# Patient Record
Sex: Male | Born: 1955 | Race: White | Hispanic: No | Marital: Married | Smoking: Never smoker
Health system: Southern US, Community
[De-identification: ages and names within clinical notes are randomized; demographics above are authoritative.]

## PROBLEM LIST (undated history)

## (undated) DIAGNOSIS — G4730 Sleep apnea, unspecified: Secondary | ICD-10-CM

## (undated) HISTORY — PX: INGUINAL HERNIA REPAIR: SUR1180

## (undated) HISTORY — PX: KNEE ARTHROSCOPY: SUR90

## (undated) HISTORY — PX: CORONARY ANGIOPLASTY: SHX604

---
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
## 1983-05-08 DIAGNOSIS — F32A Depression, unspecified: Secondary | ICD-10-CM

## 1983-05-08 HISTORY — DX: Depression, unspecified: F32.A

## 1992-05-07 DIAGNOSIS — M1990 Unspecified osteoarthritis, unspecified site: Secondary | ICD-10-CM

## 1992-05-07 DIAGNOSIS — E785 Hyperlipidemia, unspecified: Secondary | ICD-10-CM

## 1992-05-07 HISTORY — DX: Unspecified osteoarthritis, unspecified site: M19.90

## 1992-05-07 HISTORY — DX: Hyperlipidemia, unspecified: E78.5

## 1999-05-08 DIAGNOSIS — I10 Essential (primary) hypertension: Secondary | ICD-10-CM

## 1999-05-08 HISTORY — DX: Essential (primary) hypertension: I10

## 2015-09-05 DIAGNOSIS — I219 Acute myocardial infarction, unspecified: Secondary | ICD-10-CM

## 2015-09-05 HISTORY — DX: Acute myocardial infarction, unspecified: I21.9

## 2016-05-07 DIAGNOSIS — I2510 Atherosclerotic heart disease of native coronary artery without angina pectoris: Secondary | ICD-10-CM

## 2016-05-07 DIAGNOSIS — C801 Malignant (primary) neoplasm, unspecified: Secondary | ICD-10-CM

## 2016-05-07 DIAGNOSIS — C140 Malignant neoplasm of pharynx, unspecified: Secondary | ICD-10-CM

## 2016-05-07 HISTORY — DX: Atherosclerotic heart disease of native coronary artery without angina pectoris: I25.10

## 2016-05-07 HISTORY — DX: Malignant neoplasm of pharynx, unspecified: C14.0

## 2016-05-07 HISTORY — DX: Malignant (primary) neoplasm, unspecified: C80.1

## 2016-12-05 HISTORY — PX: PEG PLACEMENT: SHX5437

## 2018-05-07 HISTORY — PX: ORIF ANKLE FRACTURE: SUR919

## 2018-05-07 HISTORY — PX: FRACTURE SURGERY: SHX138

## 2018-06-19 HISTORY — PX: ORIF ANKLE FRACTURE: SUR919

## 2018-11-05 DIAGNOSIS — C3490 Malignant neoplasm of unspecified part of unspecified bronchus or lung: Secondary | ICD-10-CM

## 2018-11-05 HISTORY — DX: Malignant neoplasm of unspecified part of unspecified bronchus or lung: C34.90

## 2019-05-08 HISTORY — PX: REPLACEMENT TOTAL KNEE: SUR1224

## 2019-05-28 HISTORY — PX: REPLACEMENT TOTAL KNEE: SUR1224

## 2019-06-08 HISTORY — PX: INGUINAL HERNIA REPAIR: SUR1180

## 2019-07-13 HISTORY — PX: INGUINAL HERNIA REPAIR: SUR1180

## 2019-10-16 ENCOUNTER — Other Ambulatory Visit: Payer: Self-pay | Admitting: Orthopaedic Surgery

## 2020-02-19 ENCOUNTER — Encounter (HOSPITAL_COMMUNITY): Payer: Self-pay

## 2020-02-19 ENCOUNTER — Encounter (HOSPITAL_COMMUNITY)
Admission: RE | Admit: 2020-02-19 | Discharge: 2020-02-19 | Disposition: A | Discharge status: Home / Self Care | Payer: Worker's Compensation | Source: Ambulatory Visit | Attending: Orthopaedic Surgery | Admitting: Orthopaedic Surgery

## 2020-02-19 ENCOUNTER — Other Ambulatory Visit (HOSPITAL_COMMUNITY)
Admission: RE | Admit: 2020-02-19 | Discharge: 2020-02-19 | Disposition: A | Discharge status: Home / Self Care | Payer: HRSA Program | Source: Ambulatory Visit | Attending: Orthopaedic Surgery | Admitting: Orthopaedic Surgery

## 2020-02-19 ENCOUNTER — Other Ambulatory Visit: Payer: Self-pay

## 2020-02-19 DIAGNOSIS — Z20822 Contact with and (suspected) exposure to covid-19: Secondary | ICD-10-CM | POA: Insufficient documentation

## 2020-02-19 DIAGNOSIS — Z01810 Encounter for preprocedural cardiovascular examination: Secondary | ICD-10-CM | POA: Diagnosis present

## 2020-02-19 DIAGNOSIS — Z01812 Encounter for preprocedural laboratory examination: Secondary | ICD-10-CM | POA: Insufficient documentation

## 2020-02-19 HISTORY — DX: Sleep apnea, unspecified: G47.30

## 2020-02-19 LAB — CBC
HCT: 46.5 % (ref 39.0–52.0)
Hemoglobin: 15.5 g/dL (ref 13.0–17.0)
MCH: 31.2 pg (ref 26.0–34.0)
MCHC: 33.3 g/dL (ref 30.0–36.0)
MCV: 93.6 fL (ref 80.0–100.0)
Platelets: 185 10*3/uL (ref 150–400)
RBC: 4.97 MIL/uL (ref 4.22–5.81)
RDW: 13.2 % (ref 11.5–15.5)
WBC: 4.7 10*3/uL (ref 4.0–10.5)
nRBC: 0 % (ref 0.0–0.2)

## 2020-02-19 LAB — BASIC METABOLIC PANEL
Anion gap: 10 (ref 5–15)
BUN: 10 mg/dL (ref 8–23)
CO2: 26 mmol/L (ref 22–32)
Calcium: 9.3 mg/dL (ref 8.9–10.3)
Chloride: 102 mmol/L (ref 98–111)
Creatinine, Ser: 1.24 mg/dL (ref 0.61–1.24)
GFR, Estimated: >60 mL/min (ref >60)
Glucose, Bld: 107 mg/dL — ABNORMAL HIGH (ref 70–99)
Potassium: 3.8 mmol/L (ref 3.5–5.1)
Sodium: 138 mmol/L (ref 135–145)

## 2020-02-19 LAB — SARS CORONAVIRUS 2 (TAT 6-24 HRS): SARS Coronavirus 2: NEGATIVE

## 2020-02-19 LAB — SURGICAL PCR SCREEN
MRSA, PCR: NEGATIVE
Staphylococcus aureus: NEGATIVE

## 2020-02-19 NOTE — Progress Notes (Signed)
PCP - Dr. Ouida Sills with [facility] [place], [place] Cardiologist - Dr. Virgina Jock in [place], [place]  Chest x-ray - per patient 9 weeks ago EKG - 02/19/20 Stress Test - 10/16/18 ECHO - 04/19/17 Cardiac Cath - May 2017   Sleep Study - Yes OSA CPAP - Not currently using   DM - Patient denies  No DM medication nor CBG's checks  Aspirin Instructions: will check with Dr. Lucia Gaskins for instruction  ERAS Protcol -Yes  PRE-SURGERY Ensure given per patient not diabetic.   COVID TEST- 02/19/20  Anesthesia review: Yes cardiac history  Patient denies shortness of breath, fever, cough and chest pain at PAT appointment   All instructions explained to the patient, with a verbal understanding of the material. Patient agrees to go over the instructions while at home for a better understanding. Patient also instructed to self quarantine after being tested for COVID-19. The opportunity to ask questions was provided.

## 2020-02-19 NOTE — Progress Notes (Signed)
[id number] - [place], [place] - [address] [address] [place] [address] Phone: [phone] Fax: [phone]      Your procedure is scheduled on Tuesday October 19th.  Report to [facility] Main Entrance "A" at 9:30 A.M., and check in at the Admitting office.  Call this number if you have problems the morning of surgery:  [phone]  Call [phone] if you have any questions prior to your surgery date Monday-Friday 8am-4pm    Remember:  Do not eat after midnight the night before your surgery  You may drink clear liquids until 8:30am the morning of your surgery.   Clear liquids allowed are: Water, Non-Citrus Juices (without pulp), Carbonated Beverages, Clear Tea, Black Coffee Only, and Gatorade    Enhanced Recovery after Surgery for Orthopedics Enhanced Recovery after Surgery is a protocol used to improve the stress on your body and your recovery after surgery.  Patient Instructions  . The night before surgery:  o No food after midnight. ONLY clear liquids after midnight   . The day of surgery (if you have diabetes): o  o Drink ONE (1) Gatorade 2 (G2) by __8:30am___ the morning of surgery o This drink was given to you during your hospital  pre-op appointment visit.  o Color of the Gatorade may vary. Red is not allowed. o Nothing else to drink after completing the  Gatorade 2 (G2).         If you have questions, please contact your surgeon's office.   Take these medicines the morning of surgery with A SIP OF WATER   amLODipine (NORVASC) 10 MG tablet  atorvastatin (LIPITOR) 80 MG tablet  levothyroxine (SYNTHROID) 175 MCG tablet  metoprolol tartrate (LOPRESSOR) 25 MG tablet  diphenhydrAMINE (BENADRYL) 25 MG tablet    IF NEEDED  acetaminophen (TYLENOL) 650 MG CR tablet  As of today, STOP taking any Aspirin (unless otherwise instructed by your surgeon) Aleve, Naproxen, Ibuprofen, Motrin, Advil, Goody's, BC's, all herbal medications,  fish oil, and all vitamins.                      Do not wear jewelry            Do not wear lotions, powders, colognes, or deodorant.            Do not shave 48 hours prior to surgery.  Men may shave face and neck.            Do not bring valuables to the hospital.            [facility] is not responsible for any belongings or valuables.  Do NOT Smoke (Tobacco/Vaping) or drink Alcohol 24 hours prior to your procedure If you use a CPAP at night, you may bring all equipment for your overnight stay.   Contacts, glasses, dentures or bridgework may not be worn into surgery.      For patients admitted to the hospital, discharge time will be determined by your treatment team.   Patients discharged the day of surgery will not be allowed to drive home, and someone needs to stay with them for 24 hours.    Special instructions:   [place]- Preparing For Surgery  Before surgery, you can play an important role. Because skin is not sterile, your skin needs to be as free of germs as possible. You can reduce the number of germs on your skin by washing with CHG (chlorahexidine gluconate) Soap before surgery.  CHG is an antiseptic cleaner which kills germs and bonds with the skin to continue killing germs even after washing.    Oral Hygiene is also important to reduce your risk of infection.  Remember - BRUSH YOUR TEETH THE MORNING OF SURGERY WITH YOUR REGULAR TOOTHPASTE  Please do not use if you have an allergy to CHG or antibacterial soaps. If your skin becomes reddened/irritated stop using the CHG.  Do not shave (including legs and underarms) for at least 48 hours prior to first CHG shower. It is OK to shave your face.  Please follow these instructions carefully.   1. Shower the NIGHT BEFORE SURGERY and the MORNING OF SURGERY with CHG Soap.   2. If you chose to wash your hair, wash your hair first as usual with your normal shampoo.  3. After you shampoo, rinse your hair and body thoroughly  to remove the shampoo.  4. Use CHG as you would any other liquid soap. You can apply CHG directly to the skin and wash gently with a scrungie or a clean washcloth.   5. Apply the CHG Soap to your body ONLY FROM THE NECK DOWN.  Do not use on open wounds or open sores. Avoid contact with your eyes, ears, mouth and genitals (private parts). Wash Face and genitals (private parts)  with your normal soap.   6. Wash thoroughly, paying special attention to the area where your surgery will be performed.  7. Thoroughly rinse your body with warm water from the neck down.  8. DO NOT shower/wash with your normal soap after using and rinsing off the CHG Soap.  9. Pat yourself dry with a CLEAN TOWEL.  10. Wear CLEAN PAJAMAS to bed the night before surgery  11. Place CLEAN SHEETS on your bed the night of your first shower and DO NOT SLEEP WITH PETS.   Day of Surgery: Wear Clean/Comfortable clothing the morning of surgery Do not apply any deodorants/lotions.   Remember to brush your teeth WITH YOUR REGULAR TOOTHPASTE.   Please read over the following fact sheets that you were given.

## 2020-02-20 LAB — HEMOGLOBIN A1C
Hgb A1c MFr Bld: 6.2 % — ABNORMAL HIGH (ref 4.8–5.6)
Mean Plasma Glucose: 131 mg/dL

## 2020-02-22 ENCOUNTER — Encounter (HOSPITAL_COMMUNITY): Payer: Self-pay

## 2020-02-22 NOTE — Anesthesia Preprocedure Evaluation (Addendum)
Anesthesia Evaluation  Patient identified by MRN, date of birth, ID band Patient awake    Reviewed: Allergy & Precautions, NPO status , Patient's Chart, lab work & pertinent test results  Airway Mallampati: II  TM Distance: >3 FB Neck ROM: Full   Comment: Some stiff neck tissue but normal neck extension and mouth opening Dental  (+) Teeth Intact, Dental Advisory Given   Pulmonary sleep apnea ,  Lung ca 11/2018 Has not been using CPAP since throat ca d/t drying   Pulmonary exam normal breath sounds clear to auscultation       Cardiovascular hypertension, Pt. on medications + CAD, + Past MI (2017) and + Cardiac Stents  Normal cardiovascular exam Rhythm:Regular Rate:Normal  out-of-hospital cardiac VF arrest/defib x2, s/p LCX stent 09/2015; because of recurrent VF EP testing done prior to discharge and could not induced VT/VF   Neuro/Psych PSYCHIATRIC DISORDERS Depression negative neurological ROS     GI/Hepatic negative GI ROS, Neg liver ROS,   Endo/Other  Hypothyroidism a1c 6.2  Renal/GU negative Renal ROS  negative genitourinary   Musculoskeletal  (+) Arthritis , Osteoarthritis,  Post-traumatic ankle arthritis   Abdominal Normal abdominal exam  (+)   Peds negative pediatric ROS (+)  Hematology negative hematology ROS (+)   Anesthesia Other Findings Hx throat ca s/p peg 2018, s/p radiation to neck and chemo. currently doing chemo for lung mets   Reproductive/Obstetrics negative OB ROS                          Anesthesia Physical Anesthesia Plan  ASA: III  Anesthesia Plan: General and Regional   Post-op Pain Management: GA combined w/ Regional for post-op pain   Induction: Intravenous  PONV Risk Score and Plan: 2 and Ondansetron, Dexamethasone, Midazolam and Treatment may vary due to age or medical condition  Airway Management Planned: Oral ETT and Video Laryngoscope Planned  Additional  Equipment: None  Intra-op Plan:   Post-operative Plan: Extubation in OR  Informed Consent: I have reviewed the patients History and Physical, chart, labs and discussed the procedure including the risks, benefits and alternatives for the proposed anesthesia with the patient or authorized representative who has indicated his/her understanding and acceptance.     Dental advisory given  Plan Discussed with: CRNA  Anesthesia Plan Comments:      Anesthesia Quick Evaluation

## 2020-02-22 NOTE — Progress Notes (Signed)
Anesthesia Chart Review:  Case: [id number] Date/Time: 02/23/20 1115   Procedures:      RIGHT ANKLE ARTHRODESIS (Right Ankle) - PROCEDURE: RIGHT ANKLE ARTHRODESIS, TENDO ACHILLES LENGTHENING, POSSIBLE DEEP HARDWARE REMOVAL LENGTH OF SURGERY: 2.5 HOURS GENERAL ANESTHESIA WITH PERIPHERAL BLOCK     ACHILLES TENDON LENGTHENING (Right )     POSSIBLE HARDWARE REMOVAL (Right )   Anesthesia type: General   Pre-op diagnosis: POST TRAUMATIC ANKLE ARTHRITIS, RIGHT ANKLE EQUINUS CONTRACTURE, EXOSTOSIS OF ANKLE, RETAINED DEEP ORTHOPEDIC IMPLANTS MEDIAL AND LATERAL   Location: Start OR ROOM 04 / [place] [facility]   Surgeons: Erle Crocker, MD      DISCUSSION: Patient is a 64 year old male scheduled for the above procedure.  History includes CAD (out-of-hospital cardiac VF arrest/defib x2, s/p LCX stent 09/2015; because of recurrent VF EP testing done prior to discharge and could not induced VT/VF), throat cancer (diagosed 10/2016--some notes indicate esophageal cancer and other head and neck cancer, s/p chemoradiation; history of G-tube; "presumed" lung mets diagnosed ~ 11/2018, s/p immunotherapy), HTN, DM2, OSA, hypothyroidism (radiation related), left inguinal hernia repair (07/13/19), right ankle fracture (s/p ORIF 06/19/18), right TKA (05/29/19).  Patient was last evaluated by cardiologist Dr. Sander Radon on 09/16/19 with one year follow-up planned. He had a low risk stress test in 2020. He underwent left IHR and right TKA in 2021.   02/19/2020 presurgical COVID-19 test negative.  Anesthesia team to evaluate on the day of surgery. Of note, an elective Glidescope used for 07/13/19 surgery at [facility] due to his history of neck radiation.     VS: BP 131/79   Pulse (!) 56   Temp 36.8 C (Oral)   Resp 17   Ht 5\' 8"  (1.727 m)   Wt 87.6 kg   SpO2 98%   BMI 29.38 kg/m     PROVIDERS: PCP - Drexel Iha, MD ([facility] in [place], [place], see Care Everywhere) Cardiologist - Waldon Merl, MD ([facility] in [place], [place], see Care Everywhere) Oncologist - Shelly Coss, MD [facility]) GI is with [facility]   LABS: Labs reviewed: Acceptable for surgery. (all labs ordered are listed, but only abnormal results are displayed)  Labs Reviewed  BASIC METABOLIC PANEL - Abnormal; Notable for the following components:      Result Value   Glucose, Bld 107 (*)    All other components within normal limits  HEMOGLOBIN A1C - Abnormal; Notable for the following components:   Hgb A1c MFr Bld 6.2 (*)    All other components within normal limits  SURGICAL PCR SCREEN  CBC     IMAGES:  CT Right ankle 09/24/19 ([facility]): IMPRESSION:  1. Status post tibial and fibular fracture ORIF with slight widening of the lateral ankle mortise and multiple apparent fracture fragments and possible loose bodies as described above in the tibiotalar joint. Clinical and radiographic correlation recommended.  2. Arthritic changes of the ankle and foot.    EKG: 11/03/19: According to 11/03/19 note by Irene Pap, FNP at Welcome to Medicare visit, "EKG: sinus brady rhythm, old anterior infarct, seen on previous EKGs, no Q waves. p waves before each QRS complex. No significant ST segment elevation or depression. No T wave abnormality" Tracing requested.     CV: Nuclear stress test 10/16/18 ([facility]): IMPRESSIONS:  1. Nondiagnostic Lexiscan Study.  2. Normal and good quality SPECT Cardiolite study without convincing evidence for infarction or ischemia. This is a low risk study.  3.  Preserved left ventricular function. The ejection fraction was quantitated at 60% without wall motion abnormalities.  4. No previous study.   Echo 04/19/17 ([facility]): Normal biventricular systolic function, LVEF 90%  Normal LV diastolic function  Mild mitral regurgitation  Mild tricuspid regurgitation  Mild pulmonary hypertension (RVSP 35 mmHg)  By [facility] notes, he had stent to LCX in  09/2015 after having an out-of-hospital VF arrest. Cath report is not available in [facility].    Past Medical History:  Diagnosis Date  . Arthritis 1994  . Cancer ([place]) 2018  . Coronary artery disease    Cardiologist is Waldon Merl, MD [place] (02/19/20)  . Depression 1985  . Hyperlipidemia 1994  . Hypertension 2001  . Lung cancer ([place]) 11/2018  . Myocardial infarction ([place]) 09/2015  . Sleep apnea 2006-2007  . Throat cancer ([place]) 2018    Past Surgical History:  Procedure Laterality Date  . CORONARY ANGIOPLASTY     LCX stent 09/2015  . EYE SURGERY Left 1964   Correction of lazy eye  . FRACTURE SURGERY Right 2020  . INGUINAL HERNIA REPAIR Left 07/13/2019  . INGUINAL HERNIA REPAIR Right 2002-2003  . KNEE ARTHROSCOPY Right 1999 and 2005   Left knee arthroscopy 1994  . ORIF ANKLE FRACTURE Right 06/19/2018  . PEG PLACEMENT  12/2016   New PEG placed September 2020 removed November 2020  . REPLACEMENT TOTAL KNEE Right 05/28/2019  . TONSILLECTOMY  1963    MEDICATIONS: . acetaminophen (TYLENOL) 650 MG CR tablet  . amLODipine (NORVASC) 10 MG tablet  . aspirin EC 81 MG tablet  . atorvastatin (LIPITOR) 80 MG tablet  . Calcium-Magnesium-Zinc (CAL-MAG-ZINC PO)  . diphenhydrAMINE (BENADRYL) 25 MG tablet  . levothyroxine (SYNTHROID) 175 MCG tablet  . metoprolol tartrate (LOPRESSOR) 25 MG tablet  . Multiple Vitamins-Minerals (MULTIVITAMIN WITH MINERALS) tablet  . Potassium 99 MG TABS  . Specialty Vitamins Products (PROSTATE) TABS   No current facility-administered medications for this encounter.    Myra Gianotti, PA-C Surgical Short Stay/Anesthesiology [facility] Phone [phone] [facility] Phone [phone] 02/22/2020 11:47 AM

## 2020-02-23 ENCOUNTER — Encounter (HOSPITAL_COMMUNITY)
Admission: RE | Disposition: A | Discharge status: Home / Self Care | Payer: Self-pay | Source: Home / Self Care | Attending: Orthopaedic Surgery

## 2020-02-23 ENCOUNTER — Ambulatory Visit (HOSPITAL_COMMUNITY): Payer: Worker's Compensation

## 2020-02-23 ENCOUNTER — Ambulatory Visit (HOSPITAL_COMMUNITY)
Admission: RE | Admit: 2020-02-23 | Discharge: 2020-02-23 | Disposition: A | Discharge status: Home / Self Care | Payer: Worker's Compensation | Attending: Orthopaedic Surgery | Admitting: Orthopaedic Surgery

## 2020-02-23 ENCOUNTER — Ambulatory Visit (HOSPITAL_COMMUNITY): Payer: Worker's Compensation | Admitting: Vascular Surgery

## 2020-02-23 ENCOUNTER — Ambulatory Visit (HOSPITAL_COMMUNITY): Payer: Worker's Compensation | Admitting: Certified Registered"

## 2020-02-23 ENCOUNTER — Encounter (HOSPITAL_COMMUNITY): Payer: Self-pay | Admitting: Orthopaedic Surgery

## 2020-02-23 ENCOUNTER — Other Ambulatory Visit: Payer: Self-pay

## 2020-02-23 DIAGNOSIS — G4730 Sleep apnea, unspecified: Secondary | ICD-10-CM | POA: Insufficient documentation

## 2020-02-23 DIAGNOSIS — M12571 Traumatic arthropathy, right ankle and foot: Secondary | ICD-10-CM | POA: Diagnosis present

## 2020-02-23 DIAGNOSIS — E039 Hypothyroidism, unspecified: Secondary | ICD-10-CM | POA: Insufficient documentation

## 2020-02-23 DIAGNOSIS — I2510 Atherosclerotic heart disease of native coronary artery without angina pectoris: Secondary | ICD-10-CM | POA: Diagnosis not present

## 2020-02-23 DIAGNOSIS — Z79890 Hormone replacement therapy (postmenopausal): Secondary | ICD-10-CM | POA: Diagnosis not present

## 2020-02-23 DIAGNOSIS — Z79899 Other long term (current) drug therapy: Secondary | ICD-10-CM | POA: Diagnosis not present

## 2020-02-23 DIAGNOSIS — I252 Old myocardial infarction: Secondary | ICD-10-CM | POA: Diagnosis not present

## 2020-02-23 DIAGNOSIS — Z7982 Long term (current) use of aspirin: Secondary | ICD-10-CM | POA: Insufficient documentation

## 2020-02-23 DIAGNOSIS — I10 Essential (primary) hypertension: Secondary | ICD-10-CM | POA: Insufficient documentation

## 2020-02-23 DIAGNOSIS — Z419 Encounter for procedure for purposes other than remedying health state, unspecified: Secondary | ICD-10-CM

## 2020-02-23 DIAGNOSIS — Z85118 Personal history of other malignant neoplasm of bronchus and lung: Secondary | ICD-10-CM | POA: Insufficient documentation

## 2020-02-23 HISTORY — PX: ACHILLES TENDON LENGTHENING: SHX6455

## 2020-02-23 HISTORY — PX: ANKLE FUSION: SHX881

## 2020-02-23 HISTORY — PX: HARDWARE REMOVAL: SHX979

## 2020-02-23 SURGERY — ARTHRODESIS ANKLE
Anesthesia: Regional | Site: Ankle | Laterality: Right

## 2020-02-23 MED ORDER — FENTANYL CITRATE (PF) 100 MCG/2ML IJ SOLN
INTRAMUSCULAR | Status: AC
  Administered 2020-02-23: 50 ug via INTRAVENOUS
  Filled 2020-02-23: qty 2

## 2020-02-23 MED ORDER — CEFAZOLIN SODIUM-DEXTROSE 2-4 GM/100ML-% IV SOLN
2.0000 g | INTRAVENOUS | Status: AC
  Administered 2020-02-23: 2 g via INTRAVENOUS
  Filled 2020-02-23: qty 100

## 2020-02-23 MED ORDER — FENTANYL CITRATE (PF) 250 MCG/5ML IJ SOLN
INTRAMUSCULAR | Status: DC | PRN
  Administered 2020-02-23 (×2): 50 ug via INTRAVENOUS

## 2020-02-23 MED ORDER — PHENYLEPHRINE HCL-NACL 10-0.9 MG/250ML-% IV SOLN
INTRAVENOUS | Status: DC | PRN
  Administered 2020-02-23: 20 ug/min via INTRAVENOUS

## 2020-02-23 MED ORDER — MIDAZOLAM HCL 2 MG/2ML IJ SOLN: 1.0000 mg | Freq: Once | INTRAMUSCULAR | Status: AC

## 2020-02-23 MED ORDER — ACETAMINOPHEN 500 MG PO TABS
ORAL_TABLET | ORAL | Status: AC
  Administered 2020-02-23: 1000 mg via ORAL
  Filled 2020-02-23: qty 2

## 2020-02-23 MED ORDER — DEXAMETHASONE SODIUM PHOSPHATE 10 MG/ML IJ SOLN
INTRAMUSCULAR | Status: AC
  Filled 2020-02-23: qty 1

## 2020-02-23 MED ORDER — ONDANSETRON HCL 4 MG/2ML IJ SOLN
INTRAMUSCULAR | Status: AC
  Filled 2020-02-23: qty 2

## 2020-02-23 MED ORDER — LIDOCAINE 2% (20 MG/ML) 5 ML SYRINGE
INTRAMUSCULAR | Status: AC
  Filled 2020-02-23: qty 15

## 2020-02-23 MED ORDER — ROCURONIUM BROMIDE 10 MG/ML (PF) SYRINGE
PREFILLED_SYRINGE | INTRAVENOUS | Status: DC | PRN
  Administered 2020-02-23: 20 mg via INTRAVENOUS
  Administered 2020-02-23: 100 mg via INTRAVENOUS

## 2020-02-23 MED ORDER — CHLORHEXIDINE GLUCONATE 0.12 % MT SOLN
15.0000 mL | Freq: Once | OROMUCOSAL | Status: AC
  Administered 2020-02-23: 15 mL via OROMUCOSAL
  Filled 2020-02-23: qty 15

## 2020-02-23 MED ORDER — ACETAMINOPHEN 500 MG PO TABS: 1000.0000 mg | ORAL_TABLET | Freq: Once | ORAL | Status: AC

## 2020-02-23 MED ORDER — PROPOFOL 10 MG/ML IV BOLUS
INTRAVENOUS | Status: DC | PRN
  Administered 2020-02-23: 120 mg via INTRAVENOUS

## 2020-02-23 MED ORDER — PHENYLEPHRINE 40 MCG/ML (10ML) SYRINGE FOR IV PUSH (FOR BLOOD PRESSURE SUPPORT)
PREFILLED_SYRINGE | INTRAVENOUS | Status: AC
  Filled 2020-02-23: qty 20

## 2020-02-23 MED ORDER — ROCURONIUM BROMIDE 10 MG/ML (PF) SYRINGE
PREFILLED_SYRINGE | INTRAVENOUS | Status: AC
  Filled 2020-02-23: qty 50

## 2020-02-23 MED ORDER — PROPOFOL 10 MG/ML IV BOLUS
INTRAVENOUS | Status: AC
  Filled 2020-02-23: qty 20

## 2020-02-23 MED ORDER — ONDANSETRON HCL 4 MG/2ML IJ SOLN
INTRAMUSCULAR | Status: DC | PRN
  Administered 2020-02-23: 4 mg via INTRAVENOUS

## 2020-02-23 MED ORDER — MIDAZOLAM HCL 2 MG/2ML IJ SOLN
INTRAMUSCULAR | Status: AC
  Administered 2020-02-23: 1 mg via INTRAVENOUS
  Filled 2020-02-23: qty 2

## 2020-02-23 MED ORDER — ORAL CARE MOUTH RINSE: 15.0000 mL | Freq: Once | OROMUCOSAL | Status: AC

## 2020-02-23 MED ORDER — ROPIVACAINE HCL 5 MG/ML IJ SOLN
INTRAMUSCULAR | Status: DC | PRN
  Administered 2020-02-23: 40 mL via PERINEURAL

## 2020-02-23 MED ORDER — SUGAMMADEX SODIUM 200 MG/2ML IV SOLN
INTRAVENOUS | Status: DC | PRN
  Administered 2020-02-23: 200 mg via INTRAVENOUS

## 2020-02-23 MED ORDER — ASPIRIN 325 MG PO TABS: 325.0000 mg | ORAL_TABLET | Freq: Every day | ORAL | 11 refills | Status: AC

## 2020-02-23 MED ORDER — FENTANYL CITRATE (PF) 100 MCG/2ML IJ SOLN: 50.0000 ug | Freq: Once | INTRAMUSCULAR | Status: AC

## 2020-02-23 MED ORDER — SUCCINYLCHOLINE CHLORIDE 200 MG/10ML IV SOSY
PREFILLED_SYRINGE | INTRAVENOUS | Status: AC
  Filled 2020-02-23: qty 10

## 2020-02-23 MED ORDER — LIDOCAINE 2% (20 MG/ML) 5 ML SYRINGE
INTRAMUSCULAR | Status: DC | PRN
  Administered 2020-02-23: 20 mg via INTRAVENOUS

## 2020-02-23 MED ORDER — OXYCODONE HCL 5 MG PO TABS: 5.0000 mg | ORAL_TABLET | Freq: Three times a day (TID) | ORAL | 0 refills | Status: AC | PRN

## 2020-02-23 MED ORDER — 0.9 % SODIUM CHLORIDE (POUR BTL) OPTIME
TOPICAL | Status: DC | PRN
  Administered 2020-02-23: 1000 mL

## 2020-02-23 MED ORDER — EPHEDRINE 5 MG/ML INJ
INTRAVENOUS | Status: AC
  Filled 2020-02-23: qty 10

## 2020-02-23 MED ORDER — DEXAMETHASONE SODIUM PHOSPHATE 10 MG/ML IJ SOLN
INTRAMUSCULAR | Status: DC | PRN
  Administered 2020-02-23: 10 mg

## 2020-02-23 MED ORDER — EPHEDRINE SULFATE 50 MG/ML IJ SOLN
INTRAMUSCULAR | Status: DC | PRN
  Administered 2020-02-23: 5 mg via INTRAVENOUS

## 2020-02-23 MED ORDER — FENTANYL CITRATE (PF) 250 MCG/5ML IJ SOLN
INTRAMUSCULAR | Status: AC
Start: 2020-02-23 — End: ?
  Filled 2020-02-23: qty 5

## 2020-02-23 MED ORDER — LACTATED RINGERS IV SOLN: INTRAVENOUS | Status: DC

## 2020-02-23 SURGICAL SUPPLY — 66 items
BANDAGE ESMARK 6X9 LF (GAUZE/BANDAGES/DRESSINGS) ×2 IMPLANT
BIT DRILL CANNULATED 4.6 (BIT) ×3 IMPLANT
BIT DRILL LONG 3.1X160 (DRILL) ×2 IMPLANT
BIT DRILL SOLID LONG 2.8X160 (DRILL) ×2 IMPLANT
BLADE SURG 15 STRL LF DISP TIS (BLADE) ×8 IMPLANT
BLADE SURG 15 STRL SS (BLADE) ×4
BNDG ELASTIC 6X10 VLCR STRL LF (GAUZE/BANDAGES/DRESSINGS) ×3 IMPLANT
BNDG ESMARK 6X9 LF (GAUZE/BANDAGES/DRESSINGS) ×3
CHLORAPREP W/TINT 26 (MISCELLANEOUS) ×3 IMPLANT
COVER SURGICAL LIGHT HANDLE (MISCELLANEOUS) ×3 IMPLANT
CUFF TOURN SGL QUICK 34 (TOURNIQUET CUFF) ×1
CUFF TRNQT CYL 34X4.125X (TOURNIQUET CUFF) ×2 IMPLANT
DRAPE C-ARM 42X72 X-RAY (DRAPES) ×3 IMPLANT
DRAPE C-ARMOR (DRAPES) ×3 IMPLANT
DRAPE U-SHAPE 47X51 STRL (DRAPES) ×3 IMPLANT
DRILL LONG 3.1X160 (DRILL) ×3
DRILL SOLID LONG 2.8X160 (DRILL) ×3
ELECT REM PT RETURN 9FT ADLT (ELECTROSURGICAL) ×3
ELECTRODE REM PT RTRN 9FT ADLT (ELECTROSURGICAL) ×2 IMPLANT
GAUZE SPONGE 4X4 12PLY STRL (GAUZE/BANDAGES/DRESSINGS) ×3 IMPLANT
GAUZE XEROFORM 5X9 LF (GAUZE/BANDAGES/DRESSINGS) ×3 IMPLANT
GLOVE BIO SURGEON STRL SZ8 (GLOVE) ×3 IMPLANT
GLOVE BIOGEL M STRL SZ7.5 (GLOVE) ×3 IMPLANT
GLOVE BIOGEL PI IND STRL 8 (GLOVE) ×4 IMPLANT
GLOVE BIOGEL PI INDICATOR 8 (GLOVE) ×2
GOWN STRL REUS W/ TWL LRG LVL3 (GOWN DISPOSABLE) ×2 IMPLANT
GOWN STRL REUS W/ TWL XL LVL3 (GOWN DISPOSABLE) ×4 IMPLANT
GOWN STRL REUS W/TWL LRG LVL3 (GOWN DISPOSABLE) ×1
GOWN STRL REUS W/TWL XL LVL3 (GOWN DISPOSABLE) ×2
K-WIRE SINGLE TROCAR 2.3X230 (WIRE) ×6
KIT BASIN OR (CUSTOM PROCEDURE TRAY) ×3 IMPLANT
KIT TURNOVER KIT B (KITS) ×3 IMPLANT
KWIRE SINGLE TROCAR 2.3X230 (WIRE) ×4 IMPLANT
NS IRRIG 1000ML POUR BTL (IV SOLUTION) ×3 IMPLANT
PACK ORTHO EXTREMITY (CUSTOM PROCEDURE TRAY) ×3 IMPLANT
PAD ABD 7.5X8 STRL (GAUZE/BANDAGES/DRESSINGS) ×3 IMPLANT
PADDING CAST COTTON 6X4 STRL (CAST SUPPLIES) ×3 IMPLANT
PERFORATOR BONE FENS (ORTHOPEDIC DISPOSABLE SUPPLIES) ×3 IMPLANT
PLATE ANT TT PRIM RT (Plate) ×3 IMPLANT
PUTTY DBM STAGRAFT PLUS 5CC (Putty) ×3 IMPLANT
SCREW LOCK PLATE R3 4.2X30 (Screw) ×2 IMPLANT
SCREW LOCK PLATE R3 4.2X42 (Screw) ×3 IMPLANT
SCREW LOCK PLATE SB 4.5X32 (Screw) ×3 IMPLANT
SCREW LOCK PLATE SB 4.5X40 (Screw) ×3 IMPLANT
SCREW LOCK PLT 30X4.2X GRLL (Screw) ×4 IMPLANT
SCREW NLOCK PLATE SB 4.5X26 (Screw) ×3 IMPLANT
SCREW NONLOCK 4.5X28 (Screw) ×3 IMPLANT
SCREW SHORT THRD 7.0X50X17 (Screw) ×3 IMPLANT
SPLINT PLASTER CAST XFAST 5X30 (CAST SUPPLIES) ×2 IMPLANT
SPLINT PLASTER XFAST SET 5X30 (CAST SUPPLIES) ×1
STAPLER VISISTAT 35W (STAPLE) ×3 IMPLANT
SUCTION FRAZIER HANDLE 10FR (MISCELLANEOUS) ×1
SUCTION TUBE FRAZIER 10FR DISP (MISCELLANEOUS) ×2 IMPLANT
SUT ETHILON 3 0 PS 1 (SUTURE) ×6 IMPLANT
SUT FIBERWIRE 2-0 18 17.9 3/8 (SUTURE)
SUT MNCRL AB 3-0 PS2 18 (SUTURE) ×3 IMPLANT
SUT MON AB 3-0 SH 27 (SUTURE) ×2
SUT MON AB 3-0 SH27 (SUTURE) ×4 IMPLANT
SUT VIC AB 0 CT1 27 (SUTURE) ×2
SUT VIC AB 0 CT1 27XBRD ANBCTR (SUTURE) ×4 IMPLANT
SUTURE FIBERWR 2-0 18 17.9 3/8 (SUTURE) IMPLANT
TOWEL GREEN STERILE (TOWEL DISPOSABLE) ×3 IMPLANT
TUBE CONNECTING 12X1/4 (SUCTIONS) ×3 IMPLANT
UNDERPAD 30X36 HEAVY ABSORB (UNDERPADS AND DIAPERS) ×3 IMPLANT
WIRE OLIVE LRG THRD 7X1.6X80 (WIRE) ×6 IMPLANT
YANKAUER SUCT BULB TIP NO VENT (SUCTIONS) ×3 IMPLANT

## 2020-02-23 NOTE — Anesthesia Procedure Notes (Signed)
Anesthesia Regional Block: Popliteal block   Pre-Anesthetic Checklist: ,, timeout performed, Correct Patient, Correct Site, Correct Laterality, Correct Procedure, Correct Position, site marked, Risks and benefits discussed,  Surgical consent,  Pre-op evaluation,  At surgeon's request and post-op pain management  Laterality: Right  Prep: Maximum Sterile Barrier Precautions used, chloraprep       Needles:  Injection technique: Single-shot  Needle Type: Echogenic Stimulator Needle     Needle Length: 9cm  Needle Gauge: 22     Additional Needles:   Procedures:,,,, ultrasound used (permanent image in chart),,,,  Narrative:  Start time: 02/23/2020 11:10 AM End time: 02/23/2020 11:15 AM Injection made incrementally with aspirations every 5 mL.  Performed by: Personally  Anesthesiologist: Pervis Hocking, DO  Additional Notes: Monitors applied. No increased pain on injection. No increased resistance to injection. Injection made in 5cc increments. Good needle visualization. Patient tolerated procedure well.

## 2020-02-23 NOTE — Discharge Instructions (Signed)
DR. Lucia Gaskins FOOT & ANKLE SURGERY POST-OP INSTRUCTIONS   Pain Management 1. The numbing medicine and your leg will last around 18 hours, take a dose of your pain medicine as soon as you feel it wearing off to avoid rebound pain. 2. Keep your foot elevated above heart level.  Make sure that your heel hangs free ('floats'). 3. Take all prescribed medication as directed. 4. If taking narcotic pain medication you may want to use an over-the-counter stool softener to avoid constipation. 5. You may take over-the-counter NSAIDs (ibuprofen, naproxen, etc.) as well as over-the-counter acetaminophen as directed on the packaging as a supplement for your pain and may also use it to wean away from the prescription medication.  Activity ? Non-weightbearing ? Keep splint intact  First Postoperative Visit 1. Your first postop visit will be at least 2 weeks after surgery.  This should be scheduled when you schedule surgery. 2. If you do not have a postoperative visit scheduled please call [phone] to schedule an appointment. 3. At the appointment your incision will be evaluated for suture removal, x-rays will be obtained if necessary.  General Instructions 1. Swelling is very common after foot and ankle surgery.  It often takes 3 months for the foot and ankle to begin to feel comfortable.  Some amount of swelling will persist for 6-12 months. 2. DO NOT change the dressing.  If there is a problem with the dressing (too tight, loose, gets wet, etc.) please contact Dr. Pollie Friar office. 3. DO NOT get the dressing wet.  For showers you can use an over-the-counter cast cover or wrap a washcloth around the top of your dressing and then cover it with a plastic bag and tape it to your leg. 4. DO NOT soak the incision (no tubs, pools, bath, etc.) until you have approval from Dr. Lucia Gaskins.  Contact Dr. Huel Cote office or go to Emergency Room if: 1. Temperature above 101 F. 2. Increasing pain that is unresponsive to pain  medication or elevation 3. Excessive redness or swelling in your foot 4. Dressing problems - excessive bloody drainage, looseness or tightness, or if dressing gets wet 5. Develop pain, swelling, warmth, or discoloration of your calf

## 2020-02-23 NOTE — H&P (Signed)
PREOPERATIVE H&P  Chief Complaint: Right ankle osteoarthritis, posttraumatic with retained orthopedic hardware  HPI: Derek Harrison is a 64 y.o. male who presents for preoperative history and physical with a diagnosis of posttraumatic ankle arthritis with retained orthopedic hardware.  He sustained an ankle fracture several years ago that was fixed surgically.  He developed posttraumatic ankle arthritis that was painful.  He is here today for ankle arthrodesis and removal of hardware.. Symptoms are rated as moderate to severe, and have been worsening.  This is significantly impairing activities of daily living.  He has elected for surgical management.   Past Medical History:  Diagnosis Date  . Arthritis 1994  . Cancer ([place]) 2018  . Coronary artery disease    Cardiologist is Waldon Merl, MD [place] (02/19/20)  . Depression 1985  . Hyperlipidemia 1994  . Hypertension 2001  . Lung cancer ([place]) 11/2018  . Myocardial infarction ([place]) 09/2015  . Sleep apnea 2006-2007  . Throat cancer ([place]) 2018   Past Surgical History:  Procedure Laterality Date  . CORONARY ANGIOPLASTY     LCX stent 09/2015  . EYE SURGERY Left 1964   Correction of lazy eye  . FRACTURE SURGERY Right 2020  . INGUINAL HERNIA REPAIR Left 07/13/2019  . INGUINAL HERNIA REPAIR Right 2002-2003  . KNEE ARTHROSCOPY Right 1999 and 2005   Left knee arthroscopy 1994  . ORIF ANKLE FRACTURE Right 06/19/2018  . PEG PLACEMENT  12/2016   New PEG placed September 2020 removed November 2020  . REPLACEMENT TOTAL KNEE Right 05/28/2019  . TONSILLECTOMY  1963   Social History   Socioeconomic History  . Marital status: Married    Spouse name: Not on file  . Number of children: Not on file  . Years of education: Not on file  . Highest education level: Not on file  Occupational History  . Not on file  Tobacco Use  . Smoking status: Never Smoker  . Smokeless tobacco: Never Used  Vaping Use  . Vaping Use: Never used   Substance and Sexual Activity  . Alcohol use: Not Currently    Comment: Stopped 24 years ago 1997  . Drug use: Never  . Sexual activity: Yes  Other Topics Concern  . Not on file  Social History Narrative  . Not on file   Social Determinants of Health   Financial Resource Strain:   . Difficulty of Paying Living Expenses: Not on file  Food Insecurity:   . Worried About Charity fundraiser in the Last Year: Not on file  . Ran Out of Food in the Last Year: Not on file  Transportation Needs:   . Lack of Transportation (Medical): Not on file  . Lack of Transportation (Non-Medical): Not on file  Physical Activity:   . Days of Exercise per Week: Not on file  . Minutes of Exercise per Session: Not on file  Stress:   . Feeling of Stress : Not on file  Social Connections:   . Frequency of Communication with Friends and Family: Not on file  . Frequency of Social Gatherings with Friends and Family: Not on file  . Attends Religious Services: Not on file  . Active Member of Clubs or Organizations: Not on file  . Attends Archivist Meetings: Not on file  . Marital Status: Not on file   History reviewed. No pertinent family history. Allergies  Allergen Reactions  . Silvadene [Silver Sulfadiazine] Rash   Prior to Admission medications  Medication Sig Start Date End Date Taking? Authorizing Provider  acetaminophen (TYLENOL) 650 MG CR tablet Take 1,300 mg by mouth every 8 (eight) hours as needed for pain.    Yes [provider]  amLODipine (NORVASC) 10 MG tablet Take 10 mg by mouth daily.   Yes [provider]  aspirin EC 81 MG tablet Take 81 mg by mouth daily. Swallow whole.   Yes [provider]  atorvastatin (LIPITOR) 80 MG tablet Take 80 mg by mouth daily.   Yes [provider]  Calcium-Magnesium-Zinc (CAL-MAG-ZINC PO) Take 1 tablet by mouth daily.   Yes [provider]  diphenhydrAMINE (BENADRYL) 25 MG tablet Take 25 mg by mouth  2 (two) times daily.   Yes [provider]  levothyroxine (SYNTHROID) 175 MCG tablet Take 175 mcg by mouth daily before breakfast.   Yes [provider]  metoprolol tartrate (LOPRESSOR) 25 MG tablet Take 25 mg by mouth 2 (two) times daily.   Yes [provider]  Multiple Vitamins-Minerals (MULTIVITAMIN WITH MINERALS) tablet Take 1 tablet by mouth daily.   Yes [provider]  Potassium 99 MG TABS Take 99 mg by mouth daily.   Yes [provider]  Specialty Vitamins Products (PROSTATE) TABS Take 1 tablet by mouth daily. Real Prostate complete   Yes [provider]     Positive ROS: All other systems have been reviewed and were otherwise negative with the exception of those mentioned in the HPI and as above.  Physical Exam:  Vitals:   02/23/20 1120 02/23/20 1125  BP: 101/69 111/60  Pulse: (!) 51 (!) 51  Resp: 13 12  Temp:    SpO2: 98% 98%   General: Alert, no acute distress Cardiovascular: No pedal edema Respiratory: No cyanosis, no use of accessory musculature GI: No organomegaly, abdomen is soft and non-tender Skin: No lesions in the area of chief complaint Neurologic: Sensation intact distally Psychiatric: Patient is competent for consent with normal mood and affect Lymphatic: No axillary or cervical lymphadenopathy  MUSCULOSKELETAL: Right leg demonstrates palpable hardware laterally and medially about the ankle.  Some swelling.  Tenderness palpation about the ankle joint.  Ankle motion passively and actively with crepitance present active ankle motion intact.  Sensation intact distally.  Assessment: Right posttraumatic ankle arthritis with retained orthopedic hardware.   Plan: Plan for ankle arthrodesis and removal of hardware.  Patient understands the postoperative recovery requirements and agrees to comply..  We discussed the risks, benefits and alternatives of surgery which include but are not limited to wound healing  complications, infection, nonunion, malunion, need for further surgery, damage to surrounding structures and continued pain.  They understand there is no guarantees to an acceptable outcome.  After weighing these risks they opted to proceed with surgery.     Erle Crocker, MD    02/23/2020 12:29 PM

## 2020-02-23 NOTE — Anesthesia Procedure Notes (Signed)
Procedure Name: Intubation Date/Time: 02/23/2020 12:44 PM Performed by: Amadeo Garnet, CRNA Pre-anesthesia Checklist: Patient identified, Emergency Drugs available, Suction available and Patient being monitored Patient Re-evaluated:Patient Re-evaluated prior to induction Oxygen Delivery Method: Circle system utilized Preoxygenation: Pre-oxygenation with 100% oxygen Induction Type: IV induction Ventilation: Mask ventilation without difficulty Laryngoscope Size: Mac and 4 Grade View: Grade II Tube type: Oral Tube size: 7.5 mm Number of attempts: 1 Airway Equipment and Method: Stylet Placement Confirmation: ETT inserted through vocal cords under direct vision,  positive ETCO2 and breath sounds checked- equal and bilateral Secured at: 22 cm Tube secured with: Tape Dental Injury: Teeth and Oropharynx as per pre-operative assessment

## 2020-02-23 NOTE — Brief Op Note (Signed)
02/23/2020  2:58 PM  PATIENT:  Derek Harrison  64 y.o. male  PRE-OPERATIVE DIAGNOSIS:  POST TRAUMATIC ANKLE ARTHRITIS, RIGHT ANKLE EQUINUS CONTRACTURE, EXOSTOSIS OF ANKLE, RETAINED DEEP ORTHOPEDIC IMPLANTS MEDIAL AND LATERAL  POST-OPERATIVE DIAGNOSIS:  POST TRAUMATIC ANKLE ARTHRITIS, RIGHT ANKLE   PROCEDURE:   Right ankle arthrodesis Deep hardware removal, right ankle lateral Deep hardware removal, tibia  SURGEON:  Surgeon(s) and Role:    * Erle Crocker, MD - Primary  PHYSICIAN ASSISTANT:   ASSISTANTS: Kendra, RNFA  ANESTHESIA:   General endotracheal tube with peripheral nerve blockade  EBL: 35 cc  BLOOD ADMINISTERED:none  DRAINS: none   LOCAL MEDICATIONS USED:  NONE  SPECIMEN:  No Specimen  DISPOSITION OF SPECIMEN:  N/A  COUNTS:  YES  TOURNIQUET:   Total Tourniquet Time Documented: Thigh (Right) - 107 minutes Total: Thigh (Right) - 107 minutes   DICTATION: .Viviann Spare Dictation  PLAN OF CARE: Discharge to home after PACU  PATIENT DISPOSITION:  PACU - hemodynamically stable.   Delay start of Pharmacological VTE agent (>24hrs) due to surgical blood loss or risk of bleeding: no

## 2020-02-23 NOTE — Anesthesia Procedure Notes (Signed)
Anesthesia Regional Block: Adductor canal block   Pre-Anesthetic Checklist: ,, timeout performed, Correct Patient, Correct Site, Correct Laterality, Correct Procedure, Correct Position, site marked, Risks and benefits discussed,  Surgical consent,  Pre-op evaluation,  At surgeon's request and post-op pain management  Laterality: Right  Prep: Maximum Sterile Barrier Precautions used, chloraprep       Needles:  Injection technique: Single-shot  Needle Type: Echogenic Stimulator Needle     Needle Length: 9cm  Needle Gauge: 22     Additional Needles:   Procedures:,,,, ultrasound used (permanent image in chart),,,,  Narrative:  Start time: 02/23/2020 11:15 AM End time: 02/23/2020 11:20 AM Injection made incrementally with aspirations every 5 mL.  Performed by: Personally  Anesthesiologist: Pervis Hocking, DO  Additional Notes: Monitors applied. No increased pain on injection. No increased resistance to injection. Injection made in 5cc increments. Good needle visualization. Patient tolerated procedure well.

## 2020-02-23 NOTE — Anesthesia Postprocedure Evaluation (Signed)
Anesthesia Post Note  Patient: Derek Harrison  Procedure(s) Performed: RIGHT ANKLE ARTHRODESIS (Right Ankle) ACHILLES TENDON LENGTHENING (Right ) POSSIBLE HARDWARE REMOVAL (Right )     Patient location during evaluation: PACU Anesthesia Type: Regional and General Level of consciousness: awake and alert Pain management: pain level controlled Vital Signs Assessment: post-procedure vital signs reviewed and stable Respiratory status: spontaneous breathing, nonlabored ventilation, respiratory function stable and patient connected to nasal cannula oxygen Cardiovascular status: blood pressure returned to baseline and stable Postop Assessment: no apparent nausea or vomiting Anesthetic complications: no   No complications documented.  Last Vitals:  Vitals:   02/23/20 1545 02/23/20 1600  BP: 120/71 116/73  Pulse: 61 (!) 59  Resp: 14 18  Temp:  36.4 C  SpO2: 97% 95%    Last Pain:  Vitals:   02/23/20 1600  TempSrc:   PainSc: 0-No pain                 Belenda Cruise P Knoah Nedeau

## 2020-02-23 NOTE — Transfer of Care (Signed)
Immediate Anesthesia Transfer of Care Note  Patient: Derek Harrison  Procedure(s) Performed: RIGHT ANKLE ARTHRODESIS (Right Ankle) ACHILLES TENDON LENGTHENING (Right ) POSSIBLE HARDWARE REMOVAL (Right )  Patient Location: PACU  Anesthesia Type:GA combined with regional for post-op pain  Level of Consciousness: awake, alert  and oriented  Airway & Oxygen Therapy: Patient Spontanous Breathing  Post-op Assessment: Report given to RN and Post -op Vital signs reviewed and stable  Post vital signs: Reviewed and stable  Last Vitals:  Vitals Value Taken Time  BP 120/73 02/23/20 1514  Temp    Pulse 58 02/23/20 1515  Resp 12 02/23/20 1515  SpO2 95 % 02/23/20 1515  Vitals shown include unvalidated device data.  Last Pain:  Vitals:   02/23/20 1002  TempSrc: Temporal  PainSc:       Patients Stated Pain Goal: 4 (40/37/09 6438)  Complications: No complications documented.

## 2020-02-24 ENCOUNTER — Encounter (HOSPITAL_COMMUNITY): Payer: Self-pay | Admitting: Orthopaedic Surgery

## 2020-02-26 NOTE — Op Note (Addendum)
Marris Frontera male 64 y.o. 02/23/2020   PreOperative Diagnosis: Symptomatic orthopedic hardware, right ankle lateral Symptomatically orthopedic hardware, right ankle medial Ankle arthritis, posttraumatic  PostOperative Diagnosis: Same  PROCEDURE: Removal of deep hardware, lateral Removal of deep hardware, medial Ankle arthrodesis, right   SURGEON: Melony Overly, MD  ASSISTANT: Tillie Rung, RNFA  ANESTHESIA: General LMA anesthesia with peripheral nerve block  FINDINGS: Retained deep orthopedic hardware, lateral and medial Severe posttraumatic ankle arthritis  IMPLANTS: Paragon ankle arthrodesis plate  PZWCHENIDPO:64 y.o. male Sustained an ankle fracture several years ago that underwent operative treatment.  He developed severe posttraumatic ankle arthritis and was having symptoms related to his lateral and medial ankle hardware.  Patient failed conservative treatment in the form of boot immobilization, anti-inflammatories, activity modifications, physical therapy and had continued discomfort. Given this he was indicated for removal of hardware and ankle arthrodesis.  We did discuss the role of ankle arthroplasty which he declined.  Patient understood the risks, benefits and alternatives to surgery which include but are not limited to wound healing complications, infection, nonunion, malunion, need for further surgery as well as damage to surrounding structures. They also understood the potential for continued pain in that there were no guarantees of acceptable outcome After weighing these risks the patient opted to proceed with surgery.  PROCEDURE: Patient was identified in the preoperative holding area.  The Right leg was marked by myself.  Consent was signed by myself and the patient.  Block was performed by anesthesia in the preoperative holding area.  Patient was taken to the operative suite and placed supine on the operative table.  General LMA anesthesia was induced without  difficulty. Bump was placed under the operative hip and bone foam was used.  All bony prominences were well padded.  Tourniquet was placed on the operative thigh.  Preoperative antibiotics were given. The extremity was prepped and draped in the usual sterile fashion and surgical timeout was performed.  The limb was elevated and the tourniquet was inflated to 250 mmHg.  Deep hardware removal, lateral: Began by making a longitudinal incision overlying the previously made lateral incision about the fibula.  This taken sharply down to skin and subcutaneous tissue.  Blunt dissection was used to mobilize and identify the superficial peroneal nerve which was seen and protected through the case.  After protection of the superficial peroneal nerve the incision was carried down to the plate on the fibula.  The plate was identified.  Periosteal elevator was used to elevate the soft tissues off the plate.  There is some bony overgrowth distally at the plate.  Using a screwdriver the screws were removed sequentially.  Then a Soil scientist was used to remove the plate from the bone.  Then the bone was inspected and found to be fully healed.  The bony prominences and soft tissue was removed using a Roger to smooth out the fibula.  Then a curette was used to curette out the screw holes.  The wound was then irrigated copious with normal saline.  The deep tissue was closed with 3-0 Monocryl and the skin with 3-0 nylon.  Deep hardware removal, medial: We then turned our attention to the medial ankle.  Incision was made overlying the medial malleolus at the site of the previous insertion of the medial malleolar screws.  This was taken sharply down through skin and subcutaneous tissue.  Blunt dissection was used to gain access to the screw head.  Then retraction was performed and the screw head was visualized.  A screwdriver was used to remove the screw without difficulty.  Then a curette was placed within the screw hole to curet  any fibrous tissue out of there.  There is no bony prominence at the site after screw removal.  The wound was irrigated closely with normal saline.  It was closed with 3-0 Monocryl and nylon.  Ankle arthrodesis: We then turned our attention to the anterior ankle.  A separate incision was made overlying the anterior ankle for an anterior approach to the ankle joint.  This taken sharply down through skin and subcu tissue.  Skin flaps were created laterally and medially.  Then the extensor retinacular tissue was identified and incised in line with the incision.  The tibialis anterior tendon and extensor hallucis longus tendon were identified.  They were retracted.  The neurovascular bundle was identified and elevated and protected.  Then the anterior ankle joint was identified and violated.  There was a rush of synovial fluid.  Periosteal elevator was used to create subperiosteal flaps laterally medially about the anterior portion of the ankle joint.  There is a large distal tibial osteophyte.  This was removed with a rongeur.  We gained access to the ankle joint.  It was further mobilized.  There is a large dorsal osteophyte on the dorsal aspect of the talar neck region.  A Francee Piccolo was used to clear the soft.  The ankle joint was further mobilized by using a osteotome within the joint and distraction.  Then lamina spreaders were placed within the joint and the cartilage surfaces were denuded about the lateral gutter, tibial plafond region and medial gutter of the ankle and complete joint.  An osteotome, curet and rongeur was used to prep joint surfaces.  Then the joint was irrigated and found to be adequately prepared.  Then using a drill the joint surfaces were perforated.  The tibiotalar joint was then reduced under direct visualization held provisionally with K wire fixation.  Fluoroscopy confirmed appropriate position of the ankle joint in the AP and lateral planes.  Care was taken to acceptably reduce this.   That the ankle was then inspected clinically and found to be at about neutral dorsiflexion.  There was approximately 5 of external rotation.  Then the appropriate plate was selected and confirmed to fit well.  Then a combination of nonlocking and locking screws were placed through the plate for arthrodesis.  Good compression was obtained through the placement of a partially threaded cannulated screw from medial to lateral through the guide.  There was good stability of the arthrodesis site after placement of the plate.  Fluoroscopy confirmed screw length and placement was acceptable. DBX bone graft material was placed within the gaps that remained within the gutters. Then the wound was irrigated copious with normal saline.  The deep tissue was closed with a 3-0 Monocryl including the anterior ankle joint capsule. Tourniquette was released.  Hemostasis was obtained.  The retinacular tissue was then closed with 3-0 Monocryl suture.  The skin and subcu tissue was closed in a layered fashion using 3-0 Monocryl and 3-0 nylon.  Soft dressing was placed including Xeroform, 4 x 4's.  Patient was placed in a nonweightbearing short leg splint.  Counts were correct at the end the case.  He tolerated procedure well.  There were no complications.  He was awakened from anesthesia and taken to recovery in stable condition.    POST OPERATIVE INSTRUCTIONS: Nonweightbearing to operative extremity Keep splint dry Take one 325 mg aspirin  daily for DVT prophylaxis Call the office with concerns Follow-up in 2 weeks for splint removal, suture removal if appropriate and 3 views of the operative ankle, nonweightbearing  BLOOD LOSS:  Minimal         DRAINS: none         SPECIMEN: none       COMPLICATIONS:  * No complications entered in OR log *         Disposition: PACU - hemodynamically stable.         Condition: stable

## 2020-03-10 ENCOUNTER — Encounter (HOSPITAL_COMMUNITY): Payer: Self-pay | Admitting: Orthopaedic Surgery

## 2021-06-15 IMAGING — RF DG ANKLE COMPLETE 3+V*R*
1 series · 4 of 4 positions shown · non-contrast
Comparison: None.

CLINICAL DATA: Hardware removal in arthrodesis of right ankle.

EXAM:
RIGHT ANKLE - COMPLETE 3+ VIEW; DG C-ARM 1-60 MIN

[Series 1: unknown protocol · 0.14mm/px · 4 of 4 slices shown]
[im 1/4]
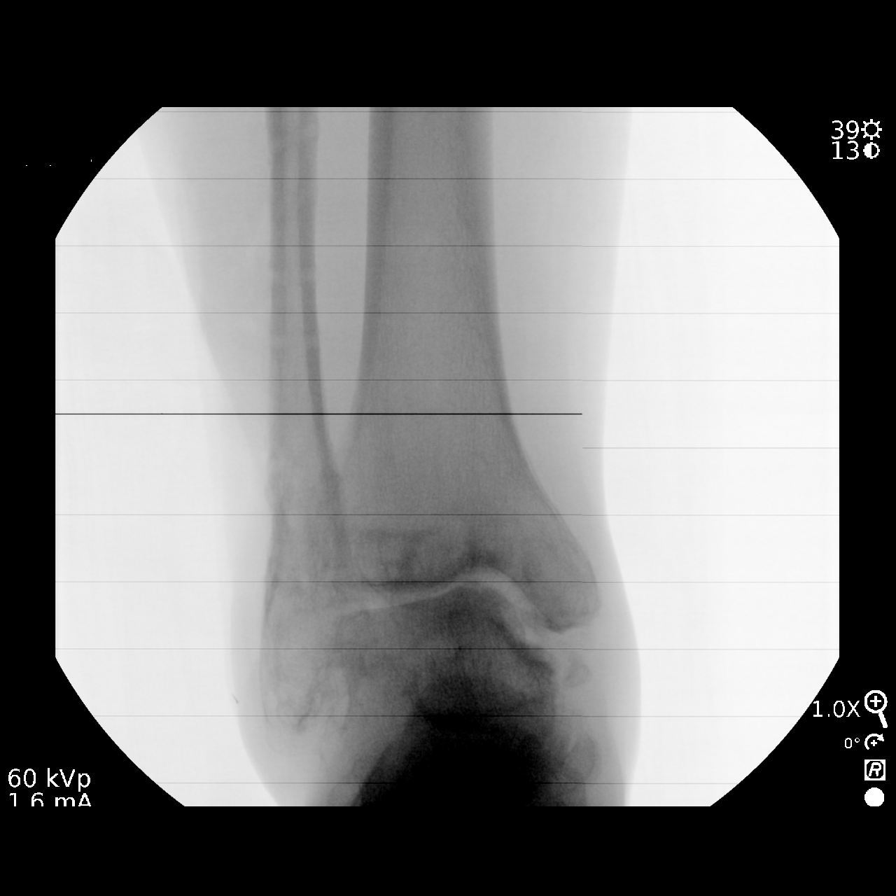
[im 2/4]
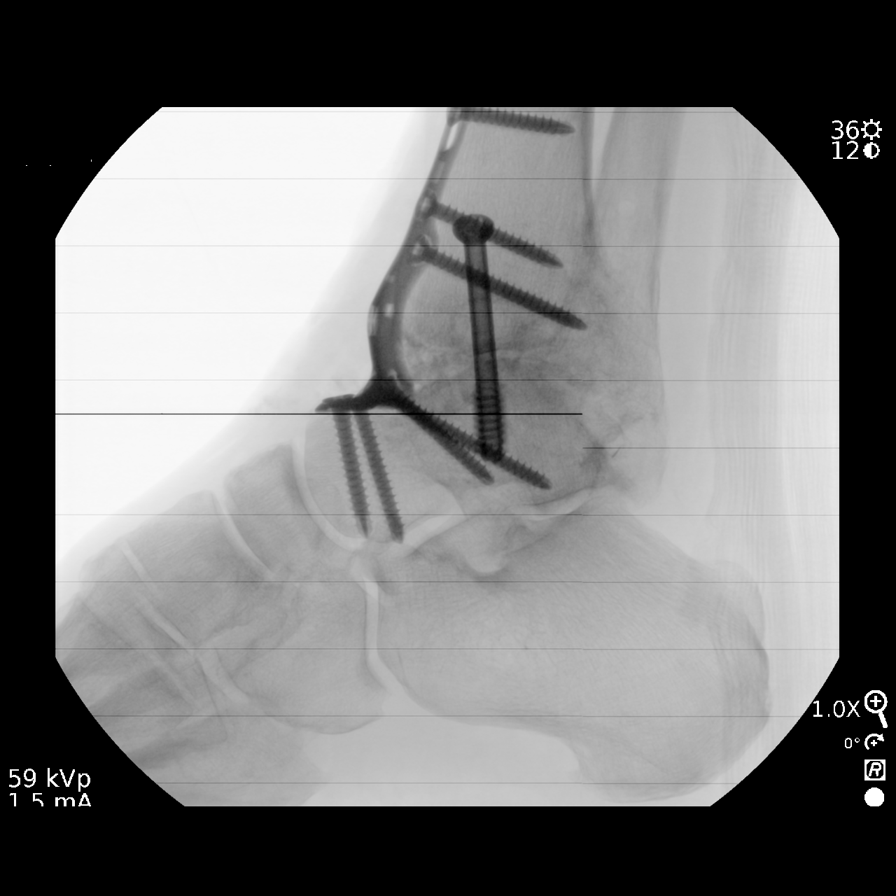
[im 3/4]
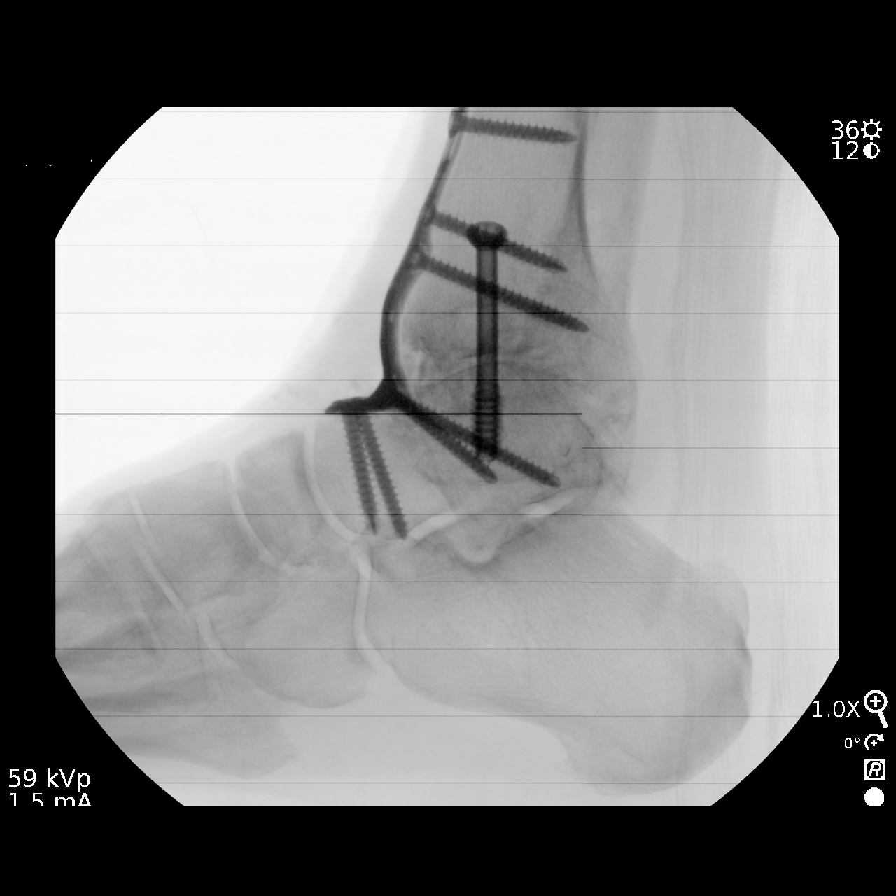
[im 4/4]
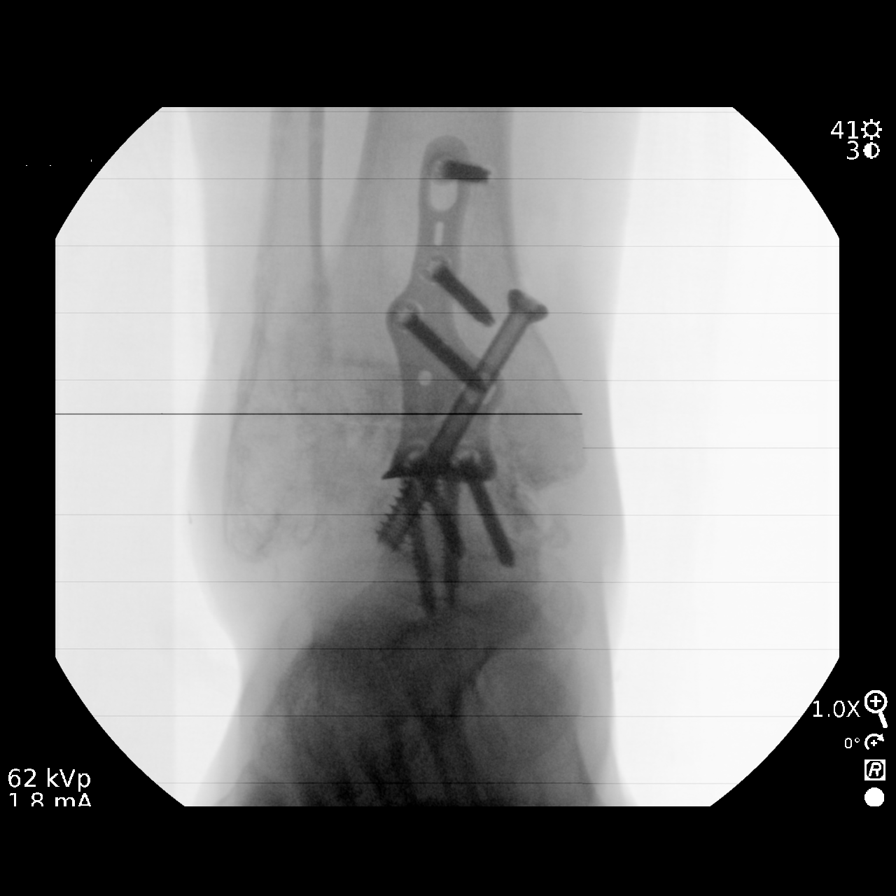

[4 of 4 positions shown; findings below may reference images not displayed]

FINDINGS: Fluoroscopic time: 37 seconds.

Four C-arm fluoroscopic images were obtained intraoperatively and
submitted for post operative interpretation. The first provided
fluoroscopic image demonstrates no hardware. The final 3 images
demonstrate fixation hardware of the distal tibia and talus. Please
see the performing provider's procedural report for further detail.
IMPRESSION: Intraoperative fluoroscopic imaging, as above.
